# Patient Record
Sex: Male | Born: 2000 | Race: White | Hispanic: No | Marital: Single | Smoking: Never smoker
Health system: Southern US, Community
[De-identification: ages and names within clinical notes are randomized; demographics above are authoritative.]

## PROBLEM LIST (undated history)

## (undated) DIAGNOSIS — I82409 Acute embolism and thrombosis of unspecified deep veins of unspecified lower extremity: Secondary | ICD-10-CM

## (undated) HISTORY — PX: THROMBOLYSIS: SHX2508

---
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
## 2022-07-08 ENCOUNTER — Other Ambulatory Visit: Payer: Self-pay

## 2022-07-08 ENCOUNTER — Emergency Department (HOSPITAL_BASED_OUTPATIENT_CLINIC_OR_DEPARTMENT_OTHER): Payer: BC Managed Care – PPO

## 2022-07-08 ENCOUNTER — Encounter (HOSPITAL_BASED_OUTPATIENT_CLINIC_OR_DEPARTMENT_OTHER): Payer: Self-pay | Admitting: Emergency Medicine

## 2022-07-08 ENCOUNTER — Emergency Department (HOSPITAL_BASED_OUTPATIENT_CLINIC_OR_DEPARTMENT_OTHER)
Admission: EM | Admit: 2022-07-08 | Discharge: 2022-07-08 | Disposition: A | Discharge status: Home / Self Care | Payer: BC Managed Care – PPO | Attending: Emergency Medicine | Admitting: Emergency Medicine

## 2022-07-08 DIAGNOSIS — M79674 Pain in right toe(s): Secondary | ICD-10-CM | POA: Diagnosis not present

## 2022-07-08 DIAGNOSIS — R600 Localized edema: Secondary | ICD-10-CM | POA: Diagnosis present

## 2022-07-08 DIAGNOSIS — Z7901 Long term (current) use of anticoagulants: Secondary | ICD-10-CM | POA: Insufficient documentation

## 2022-07-08 LAB — PROTIME-INR
INR: 2.5 — ABNORMAL HIGH (ref 0.8–1.2)
Prothrombin Time: 26.5 seconds — ABNORMAL HIGH (ref 11.4–15.2)

## 2022-07-08 MED ORDER — CEPHALEXIN 500 MG PO CAPS: 500.0000 mg | ORAL_CAPSULE | Freq: Four times a day (QID) | ORAL | 0 refills | Status: AC

## 2022-07-08 NOTE — ED Triage Notes (Signed)
Right 2 nd toe red/swollen since yesterday. Hx of blood clot right leg, on coumadin daily.

## 2022-07-08 NOTE — ED Provider Notes (Signed)
[facility] DE[facility] Provider Note   CSN: [id number] Arrival date & time: 07/08/22  1612     History  Chief Complaint  Patient presents with   Leg Swelling    John Payne is a 21 y.o. male who presents to the emergency department with concerns for right second toe red/swollen onset yesterday. Denies recent injury, trauma, fall.  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  Does not have a follow-up with primary care provider.  Has not had his INR checked in several months.  Denies chest pain or shortness of breath at this time.  The history is provided by the patient and a parent. No language interpreter was used.       Home Medications Prior to Admission medications   Medication Sig Start Date End Date Taking? Authorizing Provider  enoxaparin (LOVENOX) 150 MG/ML injection Inject 150 mg into the skin.    [provider]  morphine (MS CONTIN) 30 MG 12 hr tablet Take 30 mg by mouth every 12 (twelve) hours.    [provider]  oxycodone (OXY-IR) 5 MG capsule Take 5 mg by mouth every 4 (four) hours as needed.    [provider]  polyethylene glycol (MIRALAX / GLYCOLAX) packet Take 17 g by mouth daily.    [provider]  sennosides-docusate sodium (SENOKOT-S) 8.6-50 MG tablet Take 1 tablet by mouth daily.    [provider]      Allergies    Zofran [ondansetron]    Review of Systems   Review of Systems  Constitutional:  Negative for fever.  Respiratory:  Negative for shortness of breath.   Cardiovascular:  Negative for chest pain and leg swelling.  Musculoskeletal:  Positive for joint swelling.  Skin:  Negative for color change and wound.  All other systems reviewed and are negative.   Physical Exam Updated Vital Signs BP 126/74   Pulse 77   Temp 98.7 F (37.1 C) (Oral)   Resp 16   SpO2 99%  Physical Exam Vitals and  nursing note reviewed.  Constitutional:      General: He is not in acute distress.    Appearance: Normal appearance.  Eyes:     General: No scleral icterus.    Extraocular Movements: Extraocular movements intact.  Cardiovascular:     Rate and Rhythm: Normal rate.  Pulmonary:     Effort: Pulmonary effort is normal. No respiratory distress.  Abdominal:     Palpations: Abdomen is soft. There is no mass.     Tenderness: There is no abdominal tenderness.  Musculoskeletal:        General: Normal range of motion.     Cervical back: Neck supple.     Comments: Mild TTP noted to right second toe without overlying skin changes.  Normal flexion and extension of right toes without difficulty.  Pedal pulses intact. NVI. Capillary refill less than 2 seconds. No TTP noted to right foot/ankle/tib/fib.   Skin:    General: Skin is warm and dry.     Findings: No rash.  Neurological:     Mental Status: He is alert.     Sensory: Sensation is intact.     Motor: Motor function is intact.  Psychiatric:        Behavior: Behavior normal.     ED Results / Procedures / Treatments   Labs (all labs ordered are listed, but  only abnormal results are displayed) Labs Reviewed  PROTIME-INR - Abnormal; Notable for the following components:      Result Value   Prothrombin Time 26.5 (*)    INR 2.5 (*)    All other components within normal limits    EKG None  Radiology No results found.  Procedures Procedures    Medications Ordered in ED Medications - No data to display  ED Course/ Medical Decision Making/ A&P                           Medical Decision Making Amount and/or Complexity of Data Reviewed Labs: ordered.   Pt presents with right second toe pain/swelling onset yesterday.  History of blood clots to the right leg on Coumadin daily.  Has not had INR checked in several months. Vital signs, pt afebrile. On exam, pt with Mild TTP noted to right second toe without overlying skin changes.   Normal flexion and extension of right toes without difficulty.  Pedal pulses intact. NVI. Capillary refill less than 2 seconds. No TTP noted to right foot/ankle/tib/fib.  Differential diagnosis includes DVT, cellulitis, abscess, sprain, fracture, dislocation.    Additional history obtained:  Additional history obtained from Parent  Labs:  I ordered, and personally interpreted labs.  The pertinent results include:   INR elevated at 2.5, PT elevated 26.5  Imaging: I ordered imaging studies including  right lower DVT ultrasound with results pending at time of signout.   Patient case discussed with John Roemhildt, PA-C at sign-out. Plan at sign-out is pending [place], likely Discharge home, however, plans may change as per oncoming team. Patient care transferred at sign out.   This chart was dictated using voice recognition software, Dragon. Despite the best efforts of this provider to proofread and correct errors, errors may still occur which can change documentation meaning.   Final Clinical Impression(s) / ED Diagnoses Final diagnoses:  Pain and swelling of toe of right foot    Rx / DC Orders ED Discharge Orders     None         Monroe Qin A, PA-C 07/08/22 1853    Margette Fast, MD 07/10/22 1140

## 2022-07-08 NOTE — ED Provider Notes (Signed)
  Physical Exam  BP 121/89 (BP Location: Right Arm)   Pulse 69   Temp 98.7 F (37.1 C) (Oral)   Resp 20   SpO2 100%   Physical Exam Vitals and nursing note reviewed.  Constitutional:      Appearance: Normal appearance.  HENT:     Head: Normocephalic and atraumatic.  Eyes:     Conjunctiva/sclera: Conjunctivae normal.  Pulmonary:     Effort: Pulmonary effort is normal. No respiratory distress.  Feet:     Comments: Right second digit is erythematous and tender on palmar surface, no breaks in the skin, no nail deformity Skin:    General: Skin is warm and dry.  Neurological:     Mental Status: He is alert.  Psychiatric:        Mood and Affect: Mood normal.        Behavior: Behavior normal.    Results   Results for orders placed or performed during the hospital encounter of 07/08/22  Protime-INR  Result Value Ref Range   Prothrombin Time 26.5 (H) 11.4 - 15.2 seconds   INR 2.5 (H) 0.8 - 1.2   US Venous Img Lower Right (DVT Study)  Result Date: 07/08/2022 CLINICAL DATA:  right toe pain, on coumadin status post DVT EXAM: Right LOWER EXTREMITY VENOUS DOPPLER ULTRASOUND TECHNIQUE: Gray-scale sonography with compression, as well as color and duplex ultrasound, were performed to evaluate the deep venous system(s) from the level of the common femoral vein through the popliteal and proximal calf veins. COMPARISON:  None Available. FINDINGS: VENOUS Normal compressibility of the common femoral, superficial femoral, and popliteal veins, as well as the visualized calf veins. Visualized portions of profunda femoral vein and great saphenous vein unremarkable. No filling defects to suggest DVT on grayscale or color Doppler imaging. Doppler waveforms show normal direction of venous flow, normal respiratory plasticity and response to augmentation. Limited views of the contralateral common femoral vein are unremarkable. OTHER None. Limitations: none IMPRESSION: No deep venous thrombosis of the right  lower extremity. Electronically Signed   By: Iven Finn M.D.   On: 07/08/2022 21:13     ED Course / MDM    Medical Decision Making Amount and/or Complexity of Data Reviewed Labs: ordered.  Risk Prescription drug management.   Accepted handoff at shift change from [facility] Please see prior provider note for full HPI.  Briefly: Patient is a 21 year old male who presents to the ER for toe redness and pain. Hx of DVT on Coumadin, has not had INR checked in months.  DDX/Plan: Await US imaging to evaluate for DVT. Ultrasound negative for DVT. Imaging and INR results relayed to patient and his mother. Suspect toe redness is related to cellulitis as there was no injury to the toe. Will prescribe antibiotics and s

## 2022-07-08 NOTE — Discharge Instructions (Addendum)
You were seen in the emergency department for pain and swelling of your toe.  Your INR today was 2.5 and your ultrasound did not show any evidence of a blood clot.   I recommend following up with your vascular team to continue monitoring your Coumadin.

## 2022-07-08 NOTE — ED Notes (Signed)
Pt taken to US
